# Patient Record
Sex: Female | Born: 1980 | Race: Black or African American | Hispanic: No | Marital: Married | Smoking: Never smoker
Health system: Southern US, Community
[De-identification: ages and names within clinical notes are randomized; demographics above are authoritative.]

## PROBLEM LIST (undated history)

## (undated) DIAGNOSIS — G43909 Migraine, unspecified, not intractable, without status migrainosus: Secondary | ICD-10-CM

## (undated) HISTORY — PX: TONSILLECTOMY: SUR1361

---

## 2012-05-03 ENCOUNTER — Emergency Department (HOSPITAL_BASED_OUTPATIENT_CLINIC_OR_DEPARTMENT_OTHER): Payer: BC Managed Care – PPO

## 2012-05-03 ENCOUNTER — Encounter (HOSPITAL_BASED_OUTPATIENT_CLINIC_OR_DEPARTMENT_OTHER): Payer: Self-pay | Admitting: *Deleted

## 2012-05-03 ENCOUNTER — Emergency Department (HOSPITAL_BASED_OUTPATIENT_CLINIC_OR_DEPARTMENT_OTHER)
Admission: EM | Admit: 2012-05-03 | Discharge: 2012-05-03 | Disposition: A | Discharge status: Home / Self Care | Payer: BC Managed Care – PPO | Attending: Emergency Medicine | Admitting: Emergency Medicine

## 2012-05-03 DIAGNOSIS — Z3490 Encounter for supervision of normal pregnancy, unspecified, unspecified trimester: Secondary | ICD-10-CM

## 2012-05-03 DIAGNOSIS — O9989 Other specified diseases and conditions complicating pregnancy, childbirth and the puerperium: Secondary | ICD-10-CM | POA: Insufficient documentation

## 2012-05-03 DIAGNOSIS — R079 Chest pain, unspecified: Secondary | ICD-10-CM

## 2012-05-03 DIAGNOSIS — R109 Unspecified abdominal pain: Secondary | ICD-10-CM | POA: Insufficient documentation

## 2012-05-03 DIAGNOSIS — R0602 Shortness of breath: Secondary | ICD-10-CM | POA: Insufficient documentation

## 2012-05-03 LAB — URINALYSIS, ROUTINE W REFLEX MICROSCOPIC
Bilirubin Urine: NEGATIVE
Nitrite: NEGATIVE
Specific Gravity, Urine: 1.03 (ref 1.005–1.030)
pH: 6 (ref 5.0–8.0)

## 2012-05-03 LAB — URINE MICROSCOPIC-ADD ON

## 2012-05-03 LAB — PREGNANCY, URINE: Preg Test, Ur: POSITIVE — AB

## 2012-05-03 MED ORDER — ONDANSETRON 8 MG PO TBDP: 8.0000 mg | ORAL_TABLET | Freq: Three times a day (TID) | ORAL | Status: DC | PRN

## 2012-05-03 NOTE — ED Notes (Signed)
Patient transported to X-ray 

## 2012-05-03 NOTE — ED Notes (Signed)
Patient states she has had intermittent chest pain causing sob for the last 2 weeks.  States she does not fell like she is sick.  Became very tearful and stated she thinks she may be pregnant because she is having vomiting in the evening and has not had her menstrual since Mar 09, 2012.

## 2012-05-03 NOTE — ED Provider Notes (Signed)
History     CSN: [id number]  Arrival date & time 05/03/12  1220   First MD Initiated Contact with Patient 05/03/12 1252      Chief Complaint  Patient presents with  . Shortness of Breath    (Consider location/radiation/quality/duration/timing/severity/associated sxs/prior treatment) Patient is a 32 y.o. female presenting with shortness of breath. The history is provided by the patient.  Shortness of Breath  Associated symptoms include chest pain and shortness of breath.   patient has had chest pain in her upper chest with some shortness of breath the last 2 weeks. It comes and goes. She also has some dull pain sometimes in between episodes. She cannot make it come on. No cough. No fevers. No hemoptysis. No swelling or legs. She's not on birth control pills. She states that she's where she could be pregnant since her last period was in November. No rest pain. No vaginal bleeding or discharge.  History reviewed. No pertinent past medical history.  Past Surgical History  Procedure Date  . Cesarean section     No family history on file.  History  Substance Use Topics  . Smoking status: Never Smoker   . Smokeless tobacco: Not on file  . Alcohol Use: No    OB History    Grav Para Term Preterm Abortions TAB SAB Ect Mult Living                  Review of Systems  Constitutional: Negative for activity change and appetite change.  HENT: Negative for neck stiffness.   Eyes: Negative for pain.  Respiratory: Positive for shortness of breath. Negative for chest tightness.   Cardiovascular: Positive for chest pain. Negative for leg swelling.  Gastrointestinal: Negative for nausea, vomiting, abdominal pain and diarrhea.  Genitourinary: Negative for flank pain and vaginal bleeding.  Musculoskeletal: Negative for back pain.  Skin: Negative for rash.  Neurological: Negative for weakness, numbness and headaches.  Psychiatric/Behavioral: Negative for behavioral problems.     Allergies  Review of patient's allergies indicates no known allergies.  Home Medications  No current outpatient prescriptions on file.  BP 102/77  Pulse 76  Temp 98.9 F (37.2 C) (Oral)  Resp 20  Ht 5\' 11"  (1.803 m)  Wt 147 lb (66.679 kg)  BMI 20.50 kg/m2  SpO2 100%  LMP 03/09/2012  Physical Exam  Nursing note and vitals reviewed. Constitutional: She is oriented to person, place, and time. She appears well-developed and well-nourished.  HENT:  Head: Normocephalic and atraumatic.  Eyes: EOM are normal. Pupils are equal, round, and reactive to light.  Neck: Normal range of motion. Neck supple.  Cardiovascular: Normal rate, regular rhythm and normal heart sounds.   No murmur heard. Pulmonary/Chest: Effort normal and breath sounds normal. No respiratory distress. She has no wheezes. She has no rales.       Mild tenderness to upper chest without crepitance or deformity. No rash.  Abdominal: Soft. Bowel sounds are normal. She exhibits no distension. There is no tenderness. There is no rebound and no guarding.  Musculoskeletal: Normal range of motion.  Neurological: She is alert and oriented to person, place, and time. No cranial nerve deficit.  Skin: Skin is warm and dry.  Psychiatric: She has a normal mood and affect. Her speech is normal.    ED Course  Procedures (including critical care time)  Labs Reviewed  URINALYSIS, ROUTINE W REFLEX MICROSCOPIC - Abnormal; Notable for the following:    Ketones, ur >80 (*)  Leukocytes, UA TRACE (*)     All other components within normal limits  PREGNANCY, URINE - Abnormal; Notable for the following:    Preg Test, Ur POSITIVE (*)     All other components within normal limits  URINE MICROSCOPIC-ADD ON - Abnormal; Notable for the following:    Squamous Epithelial / LPF FEW (*)     Bacteria, UA FEW (*)     All other components within normal limits  URINE CULTURE   No results found.   1. Chest pain   2. Pregnant       Date: 05/03/2012  Rate: 70  Rhythm: normal sinus rhythm  QRS Axis: normal  Intervals: normal  ST/T Wave abnormalities: normal  Conduction Disutrbances: none  Narrative Interpretation: unremarkable     MDM  Patient presents with chest pain. As dull in the upper chest. She's had some vomiting in the evening. She is pregnant. She is well-appearing and tolerated orals. She has a few times but increase her oral intake at home. She has an obstetrician to followup with. She elected not get chest x-ray. Lungs are clear. EKG is reassuring patient was informed that we are not ruled out ectopic pregnancy, however her story is not worrisome for it now.        Juliet Rude. Rubin Payor, MD 05/03/12 1354

## 2012-05-04 LAB — URINE CULTURE
Colony Count: NO GROWTH
Culture: NO GROWTH

## 2017-09-13 ENCOUNTER — Other Ambulatory Visit: Payer: Self-pay

## 2017-09-13 ENCOUNTER — Encounter (HOSPITAL_BASED_OUTPATIENT_CLINIC_OR_DEPARTMENT_OTHER): Payer: Self-pay

## 2017-09-13 ENCOUNTER — Emergency Department (HOSPITAL_BASED_OUTPATIENT_CLINIC_OR_DEPARTMENT_OTHER)
Admission: EM | Admit: 2017-09-13 | Discharge: 2017-09-14 | Disposition: A | Discharge status: Home / Self Care | Payer: No Typology Code available for payment source | Attending: Emergency Medicine | Admitting: Emergency Medicine

## 2017-09-13 ENCOUNTER — Emergency Department (HOSPITAL_BASED_OUTPATIENT_CLINIC_OR_DEPARTMENT_OTHER): Payer: No Typology Code available for payment source

## 2017-09-13 DIAGNOSIS — Y939 Activity, unspecified: Secondary | ICD-10-CM | POA: Insufficient documentation

## 2017-09-13 DIAGNOSIS — Y92410 Unspecified street and highway as the place of occurrence of the external cause: Secondary | ICD-10-CM | POA: Diagnosis not present

## 2017-09-13 DIAGNOSIS — Y999 Unspecified external cause status: Secondary | ICD-10-CM | POA: Diagnosis not present

## 2017-09-13 DIAGNOSIS — S40012A Contusion of left shoulder, initial encounter: Secondary | ICD-10-CM | POA: Diagnosis not present

## 2017-09-13 DIAGNOSIS — S4992XA Unspecified injury of left shoulder and upper arm, initial encounter: Secondary | ICD-10-CM | POA: Diagnosis present

## 2017-09-13 HISTORY — DX: Migraine, unspecified, not intractable, without status migrainosus: G43.909
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
## 2017-09-14 NOTE — Discharge Instructions (Addendum)
Ibuprofen 600 mg every six hours as needed for pain.  Follow up with primary doctor if not improving in the next week.

## 2017-09-14 NOTE — ED Notes (Signed)
Pt verbalizes understanding of d/c instructions and denies any further need at this time. 
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
## 2020-08-30 ENCOUNTER — Emergency Department (HOSPITAL_BASED_OUTPATIENT_CLINIC_OR_DEPARTMENT_OTHER)
Admission: EM | Admit: 2020-08-30 | Discharge: 2020-08-30 | Disposition: A | Discharge status: Home / Self Care | Payer: BC Managed Care – PPO | Attending: Emergency Medicine | Admitting: Emergency Medicine

## 2020-08-30 ENCOUNTER — Encounter (HOSPITAL_BASED_OUTPATIENT_CLINIC_OR_DEPARTMENT_OTHER): Payer: Self-pay

## 2020-08-30 ENCOUNTER — Other Ambulatory Visit: Payer: Self-pay

## 2020-08-30 DIAGNOSIS — M545 Low back pain, unspecified: Secondary | ICD-10-CM | POA: Insufficient documentation

## 2020-08-30 DIAGNOSIS — M940 Chondrocostal junction syndrome [Tietze]: Secondary | ICD-10-CM | POA: Insufficient documentation

## 2020-08-30 DIAGNOSIS — R072 Precordial pain: Secondary | ICD-10-CM | POA: Diagnosis present

## 2020-08-30 MED ORDER — CYCLOBENZAPRINE HCL 10 MG PO TABS
10.0000 mg | ORAL_TABLET | Freq: Once | ORAL | Status: AC
  Administered 2020-08-30: 10 mg via ORAL
  Filled 2020-08-30: qty 1

## 2020-08-30 MED ORDER — CYCLOBENZAPRINE HCL 10 MG PO TABS: 10.0000 mg | ORAL_TABLET | Freq: Three times a day (TID) | ORAL | 0 refills | Status: AC | PRN

## 2020-08-30 MED ORDER — NAPROXEN 250 MG PO TABS
500.0000 mg | ORAL_TABLET | Freq: Once | ORAL | Status: AC
  Administered 2020-08-30: 500 mg via ORAL
  Filled 2020-08-30: qty 2

## 2020-08-30 MED ORDER — NAPROXEN 375 MG PO TABS: ORAL_TABLET | ORAL | 0 refills | Status: DC

## 2020-08-30 NOTE — ED Provider Notes (Signed)
[facility] Provider Note: [name], MD, FACEP  CSN: [id number] MRN: [id number] ARRIVAL: 08/30/20 at 0227 ROOM: [facility]07/[facility]   CHIEF COMPLAINT  Chest Pain   HISTORY OF PRESENT ILLNESS  08/30/20 3:23 AM Cristina Jones is a 40 y.o. female with about a 1 week history of pain in her chest.  The pain is located along the right edge of the sternum.  The pain has sharp and dull components and is well localized.  She has a subjective sensation of a knot at that point.  Pain is worse with movement or palpation.  She rates the pain is a 7 out of 10.  She is also had a 1 day history of pain in her mid lower back which she describes as feeling like a muscle spasm.  She rates this as a 9 out of 10.  She denies any injury to her back.  She has a sensation of swelling associated with this pain.  Past Medical History:  Diagnosis Date  . Migraine     Past Surgical History:  Procedure Laterality Date  . CESAREAN SECTION    . CESAREAN SECTION    . TONSILLECTOMY      History reviewed. No pertinent family history.  Social History   Tobacco Use  . Smoking status: Never Smoker  . Smokeless tobacco: Never Used  Substance Use Topics  . Alcohol use: No  . Drug use: No    Prior to Admission medications   Medication Sig Start Date End Date Taking? Authorizing Provider  cyclobenzaprine (FLEXERIL) 10 MG tablet Take 1 tablet (10 mg total) by mouth 3 (three) times daily as needed for muscle spasms. 08/30/20  Yes Caedin Mogan, MD  naproxen (NAPROSYN) 375 MG tablet Take 1 tablet twice daily as needed for pain. 08/30/20  Yes Aishani Kalis, MD    Allergies Patient has no known allergies.   REVIEW OF SYSTEMS  Negative except as noted here or in the History of Present Illness.   PHYSICAL EXAMINATION  Initial Vital Signs Blood pressure 119/83, pulse 61, temperature 97.9 F (36.6 C), temperature source Oral, resp. rate 14, height 5\' 8"  (1.727 m), weight 77.1 kg, SpO2 100  %.  Examination General: Well-developed, well-nourished female in no acute distress; appearance consistent with age of record HENT: normocephalic; atraumatic Eyes: Normal appearance Neck: supple Heart: regular rate and rhythm Lungs: clear to auscultation bilaterally Chest: Right upper costosternal tenderness Abdomen: soft; nondistended; nontender; bowel sounds present Back: Mid lumbar tenderness Extremities: No deformity; full range of motion Neurologic: Awake, alert and oriented; motor function intact in all extremities and symmetric; no facial droop Skin: Warm and dry Psychiatric: Normal mood and affect   RESULTS  Summary of this visit's results, reviewed and interpreted by myself:   EKG Interpretation  Date/Time:    Ventricular Rate:    PR Interval:    QRS Duration:   QT Interval:    QTC Calculation:   R Axis:     Text Interpretation:        Laboratory Studies: No results found for this or any previous visit (from the past 24 hour(s)). Imaging Studies: No results found.  ED COURSE and MDM  Nursing notes, initial and subsequent vitals signs, including pulse oximetry, reviewed and interpreted by myself.  Vitals:   08/30/20 0234 08/30/20 0238  BP: 119/83   Pulse: 61   Resp: 14   Temp: 97.9 F (36.6 C)   TempSrc: Oral   SpO2: 100%   Weight:  77.1 kg  Height:  5\' 8"  (1.727 m)   Medications  naproxen (NAPROSYN) tablet 500 mg (has no administration in time range)  cyclobenzaprine (FLEXERIL) tablet 10 mg (has no administration in time range)    Patient's chest pain is consistent with costochondritis.  The cause of her back pain is unclear but does not appear to be due to an injury.  We will treat with an NSAID and Flexeril.  PROCEDURES  Procedures   ED DIAGNOSES     ICD-10-CM   1. Costochondritis  M94.0   2. Lumbar pain  M54.50        Lavera Vandermeer, , MD 08/30/20 [phone]

## 2020-08-30 NOTE — ED Triage Notes (Signed)
States she thinks she pulled a muscle in her lower back, notices swelling also. Was at work tonight and noticed a nodule on her right upper chest associated with pain upon palpation and movement.

## 2023-01-11 ENCOUNTER — Emergency Department (HOSPITAL_BASED_OUTPATIENT_CLINIC_OR_DEPARTMENT_OTHER)
Admission: EM | Admit: 2023-01-11 | Discharge: 2023-01-12 | Disposition: A | Discharge status: Home / Self Care | Payer: No Typology Code available for payment source | Attending: Emergency Medicine | Admitting: Emergency Medicine

## 2023-01-11 ENCOUNTER — Other Ambulatory Visit: Payer: Self-pay

## 2023-01-11 ENCOUNTER — Encounter (HOSPITAL_BASED_OUTPATIENT_CLINIC_OR_DEPARTMENT_OTHER): Payer: Self-pay

## 2023-01-11 DIAGNOSIS — L02511 Cutaneous abscess of right hand: Secondary | ICD-10-CM | POA: Diagnosis present

## 2023-01-11 NOTE — ED Triage Notes (Signed)
Pt states she had a laceration on her thumb on Thursday and now has pain and numbness to that area.  +swollen area noted on knuckle

## 2023-01-12 MED ORDER — LIDOCAINE HCL 2 % IJ SOLN
10.0000 mL | Freq: Once | INTRAMUSCULAR | Status: AC
  Administered 2023-01-12: 200 mg
  Filled 2023-01-12: qty 20

## 2023-01-12 MED ORDER — DOXYCYCLINE HYCLATE 100 MG PO TABS
100.0000 mg | ORAL_TABLET | Freq: Once | ORAL | Status: AC
  Administered 2023-01-12: 100 mg via ORAL
  Filled 2023-01-12: qty 1

## 2023-01-12 MED ORDER — DOXYCYCLINE HYCLATE 100 MG PO CAPS: 100.0000 mg | ORAL_CAPSULE | Freq: Two times a day (BID) | ORAL | 0 refills | Status: AC

## 2023-01-12 NOTE — ED Provider Notes (Signed)
[place] [facility]Y DEPARTMENT AT [facility]  Provider Note  CSN: [id number] Arrival date & time: 01/11/23 2322  History Chief Complaint  Patient presents with   thumb pain    Cristina Jones is a 42 y.o. female reports she cut her R thumb at work about a week ago, did not require repair but has had increased pain, swelling since then. No drainage. Some numbness distally.    Home Medications Prior to Admission medications   Medication Sig Start Date End Date Taking? Authorizing Provider  doxycycline (VIBRAMYCIN) 100 MG capsule Take 1 capsule (100 mg total) by mouth 2 (two) times daily. 01/12/23  Yes Pollyann Savoy, MD  cyclobenzaprine (FLEXERIL) 10 MG tablet Take 1 tablet (10 mg total) by mouth 3 (three) times daily as needed for muscle spasms. 08/30/20   Molpus, John, MD  naproxen (NAPROSYN) 375 MG tablet Take 1 tablet twice daily as needed for pain. 08/30/20   Molpus, Jonny Ruiz, MD     Allergies    Patient has no known allergies.   Review of Systems   Review of Systems Please see HPI for pertinent positives and negatives  Physical Exam BP 92/68 (BP Location: Left Arm)   Pulse 79   Temp 97.8 F (36.6 C)   Resp 18   Ht 5\' 11"  (1.803 m)   Wt 80.3 kg   SpO2 100%   BMI 24.69 kg/m   Physical Exam Vitals and nursing note reviewed.  HENT:     Head: Normocephalic.     Nose: Nose normal.  Eyes:     Extraocular Movements: Extraocular movements intact.  Pulmonary:     Effort: Pulmonary effort is normal.  Musculoskeletal:        General: Normal range of motion.     Cervical back: Neck supple.  Skin:    Findings: No rash (on exposed skin).     Comments: Healing wound over the R 1st IP joint with some central fluctuance, diffuse tenderness, mild erythema.   Neurological:     Mental Status: She is alert and oriented to person, place, and time.  Psychiatric:        Mood and Affect: Mood normal.     ED Results / Procedures / Treatments    EKG None  Procedures .Marland KitchenIncision and Drainage  Date/Time: 01/12/2023 1:21 AM  Performed by: Pollyann Savoy, MD Authorized by: Pollyann Savoy, MD   Consent:    Consent obtained:  Verbal   Consent given by:  Patient Location:    Type:  Abscess   Location:  Upper extremity   Upper extremity location:  Finger   Finger location:  R thumb Pre-procedure details:    Skin preparation:  Chlorhexidine with alcohol Anesthesia:    Anesthesia method:  Nerve block   Block anesthetic:  Lidocaine 2% w/o epi   Block technique:  Digital   Block outcome:  Anesthesia achieved Procedure type:    Complexity:  Simple Procedure details:    Incision types:  Single straight   Drainage:  Purulent   Drainage amount:  Scant   Wound treatment:  Wound left open Post-procedure details:    Procedure completion:  Tolerated well, no immediate complications   Medications Ordered in the ED Medications  lidocaine (XYLOCAINE) 2 % (with pres) injection 200 mg (has no administration in time range)  doxycycline (VIBRA-TABS) tablet 100 mg (has no administration in time range)    Initial Impression and Plan  Patient here with small abscess on  dorsal R thumb after recent laceration. Drained as above. Doxycycline for surrounding cellulitis. PCP follow up, RTED for any other concerns.    ED Course       MDM Rules/Calculators/A&P Medical Decision Making Problems Addressed: Abscess of right thumb: acute illness or injury  Risk Prescription drug management.     Final Clinical Impression(s) / ED Diagnoses Final diagnoses:  Abscess of right thumb    Rx / DC Orders ED Discharge Orders          Ordered    doxycycline (VIBRAMYCIN) 100 MG capsule  2 times daily        01/12/23 0123             Pollyann Savoy, MD 01/12/23 [phone]

## 2023-03-03 ENCOUNTER — Emergency Department (HOSPITAL_BASED_OUTPATIENT_CLINIC_OR_DEPARTMENT_OTHER): Payer: No Typology Code available for payment source

## 2023-03-03 ENCOUNTER — Encounter (HOSPITAL_BASED_OUTPATIENT_CLINIC_OR_DEPARTMENT_OTHER): Payer: Self-pay | Admitting: Urology

## 2023-03-03 ENCOUNTER — Emergency Department (HOSPITAL_BASED_OUTPATIENT_CLINIC_OR_DEPARTMENT_OTHER)
Admission: EM | Admit: 2023-03-03 | Discharge: 2023-03-03 | Disposition: A | Discharge status: Home / Self Care | Payer: No Typology Code available for payment source | Attending: Emergency Medicine | Admitting: Emergency Medicine

## 2023-03-03 ENCOUNTER — Other Ambulatory Visit: Payer: Self-pay

## 2023-03-03 DIAGNOSIS — M79671 Pain in right foot: Secondary | ICD-10-CM | POA: Insufficient documentation

## 2023-03-03 DIAGNOSIS — W208XXA Other cause of strike by thrown, projected or falling object, initial encounter: Secondary | ICD-10-CM | POA: Insufficient documentation

## 2023-03-03 MED ORDER — ACETAMINOPHEN 500 MG PO TABS
1000.0000 mg | ORAL_TABLET | Freq: Once | ORAL | Status: AC
  Administered 2023-03-03: 1000 mg via ORAL
  Filled 2023-03-03: qty 2

## 2023-03-03 MED ORDER — KETOROLAC TROMETHAMINE 15 MG/ML IJ SOLN
15.0000 mg | Freq: Once | INTRAMUSCULAR | Status: AC
  Administered 2023-03-03: 15 mg via INTRAMUSCULAR
  Filled 2023-03-03: qty 1

## 2023-03-03 NOTE — Discharge Instructions (Signed)
No fracture or other concerns on the x-ray.  You likely have a contusion/bruise to the bone causing the pain.  Take Tylenol, ibuprofen as discussed.  You can take up to 1000 mg of Tylenol every 8 hours, 600 mg of ibuprofen every 6-8 hours, and apply ice every 4-5 hours for 10 to 15 minutes.  For any concerning symptoms return to the emergency room.

## 2023-03-03 NOTE — ED Triage Notes (Signed)
Pt states heavy box fell on foot this am  States pain to right great toe and 2nd toe  States can't feel much in those toes  Swelling noted

## 2023-03-03 NOTE — ED Provider Notes (Signed)
EMERGENCY DEPARTMENT AT [facility] Provider Note   CSN: [id number] Arrival date & time: 03/03/23  1112     History  Chief Complaint  Patient presents with   Toe Injury    Cristina Jones is a 42 y.o. female.  42 year old female presents today for concern of pain to the right great toe.  This started after a box of motor oil fell on her foot.  Fell from a shelf that was just slightly tolerated at her.  Denies other complaints.  The history is provided by the patient. No language interpreter was used.       Home Medications Prior to Admission medications   Medication Sig Start Date End Date Taking? Authorizing Provider  cyclobenzaprine (FLEXERIL) 10 MG tablet Take 1 tablet (10 mg total) by mouth 3 (three) times daily as needed for muscle spasms. 08/30/20   Molpus, John, MD  doxycycline (VIBRAMYCIN) 100 MG capsule Take 1 capsule (100 mg total) by mouth 2 (two) times daily. 01/12/23   Pollyann Savoy, MD  naproxen (NAPROSYN) 375 MG tablet Take 1 tablet twice daily as needed for pain. 08/30/20   Molpus, Jonny Ruiz, MD      Allergies    Patient has no known allergies.    Review of Systems   Review of Systems  Musculoskeletal:  Positive for arthralgias.  All other systems reviewed and are negative.   Physical Exam Updated Vital Signs BP 121/68 (BP Location: Right Arm)   Pulse 77   Temp 98.5 F (36.9 C) (Oral)   Resp 16   Ht 5\' 11"  (1.803 m)   Wt 78 kg   SpO2 99%   BMI 23.99 kg/m  Physical Exam Vitals and nursing note reviewed.  Constitutional:      General: She is not in acute distress.    Appearance: Normal appearance. She is not ill-appearing.  HENT:     Head: Normocephalic and atraumatic.     Nose: Nose normal.  Eyes:     Conjunctiva/sclera: Conjunctivae normal.  Pulmonary:     Effort: Pulmonary effort is normal. No respiratory distress.  Musculoskeletal:        General: No deformity.     Comments: Neurovascularly intact in the  right lower extremity.  There is tenderness palpation over the right great toe as well as just proximal to the right great toe.  Skin:    Findings: No rash.  Neurological:     Mental Status: She is alert.     ED Results / Procedures / Treatments   Labs (all labs ordered are listed, but only abnormal results are displayed) Labs Reviewed - No data to display  EKG None  Radiology No results found.  Procedures Procedures    Medications Ordered in ED Medications - No data to display  ED Course/ Medical Decision Making/ A&P                                 Medical Decision Making Amount and/or Complexity of Data Reviewed Radiology: ordered.  Risk OTC drugs. Prescription drug management.   42 year old female presents today for concern of pain to the right great toe and right midfoot.  A box of motor oil fell on her foot.  No other complaints.  X-ray obtained.  No bony abnormality.  Likely a contusion.  Symptomatic management discussed.  Discharged in stable condition.   Final Clinical Impression(s) / ED Diagnoses Final diagnoses:  Right foot pain    Rx / DC Orders ED Discharge Orders     None         Marita [place], PA-C 03/03/23 1556    Alvira Monday, MD 03/03/23 2225

## 2024-04-19 ENCOUNTER — Emergency Department (HOSPITAL_BASED_OUTPATIENT_CLINIC_OR_DEPARTMENT_OTHER)
Admission: EM | Admit: 2024-04-19 | Discharge: 2024-04-19 | Disposition: A | Discharge status: Home / Self Care | Attending: Emergency Medicine | Admitting: Emergency Medicine

## 2024-04-19 ENCOUNTER — Other Ambulatory Visit: Payer: Self-pay

## 2024-04-19 ENCOUNTER — Encounter (HOSPITAL_BASED_OUTPATIENT_CLINIC_OR_DEPARTMENT_OTHER): Payer: Self-pay | Admitting: Emergency Medicine

## 2024-04-19 DIAGNOSIS — G43909 Migraine, unspecified, not intractable, without status migrainosus: Secondary | ICD-10-CM | POA: Diagnosis present

## 2024-04-19 LAB — URINALYSIS, ROUTINE W REFLEX MICROSCOPIC
Bilirubin Urine: NEGATIVE
Glucose, UA: NEGATIVE mg/dL
Hgb urine dipstick: NEGATIVE
Ketones, ur: NEGATIVE mg/dL
Leukocytes,Ua: NEGATIVE
Nitrite: NEGATIVE
Protein, ur: NEGATIVE mg/dL
Specific Gravity, Urine: 1.01 (ref 1.005–1.030)
pH: 6.5 (ref 5.0–8.0)

## 2024-04-19 LAB — COMPREHENSIVE METABOLIC PANEL WITH GFR
ALT: 18 U/L (ref 0–44)
AST: 30 U/L (ref 15–41)
Albumin: 4.8 g/dL (ref 3.5–5.0)
Alkaline Phosphatase: 64 U/L (ref 38–126)
Anion gap: 13 (ref 5–15)
BUN: 8 mg/dL (ref 6–20)
CO2: 24 mmol/L (ref 22–32)
Calcium: 10 mg/dL (ref 8.9–10.3)
Chloride: 104 mmol/L (ref 98–111)
Creatinine, Ser: 0.78 mg/dL (ref 0.44–1.00)
GFR, Estimated: >60 mL/min
Glucose, Bld: 100 mg/dL — ABNORMAL HIGH (ref 70–99)
Potassium: 4.1 mmol/L (ref 3.5–5.1)
Sodium: 141 mmol/L (ref 135–145)
Total Bilirubin: 0.3 mg/dL (ref 0.0–1.2)
Total Protein: 8.8 g/dL — ABNORMAL HIGH (ref 6.5–8.1)

## 2024-04-19 LAB — CBC WITH DIFFERENTIAL/PLATELET
Abs Immature Granulocytes: 0.01 K/uL (ref 0.00–0.07)
Basophils Absolute: 0 K/uL (ref 0.0–0.1)
Basophils Relative: 1 %
Eosinophils Absolute: 0.1 K/uL (ref 0.0–0.5)
Eosinophils Relative: 1 %
HCT: 35.7 % — ABNORMAL LOW (ref 36.0–46.0)
Hemoglobin: 11.6 g/dL — ABNORMAL LOW (ref 12.0–15.0)
Immature Granulocytes: 0 %
Lymphocytes Relative: 38 %
Lymphs Abs: 2.6 K/uL (ref 0.7–4.0)
MCH: 26.3 pg (ref 26.0–34.0)
MCHC: 32.5 g/dL (ref 30.0–36.0)
MCV: 81 fL (ref 80.0–100.0)
Monocytes Absolute: 0.4 K/uL (ref 0.1–1.0)
Monocytes Relative: 6 %
Neutro Abs: 3.7 K/uL (ref 1.7–7.7)
Neutrophils Relative %: 54 %
Platelets: 307 K/uL (ref 150–400)
RBC: 4.41 MIL/uL (ref 3.87–5.11)
RDW: 14.2 % (ref 11.5–15.5)
WBC: 6.7 K/uL (ref 4.0–10.5)
nRBC: 0 % (ref 0.0–0.2)

## 2024-04-19 LAB — SALICYLATE LEVEL: Salicylate Lvl: <7 mg/dL — ABNORMAL LOW (ref 7.0–30.0)

## 2024-04-19 LAB — LIPASE, BLOOD: Lipase: 32 U/L (ref 11–51)

## 2024-04-19 LAB — PREGNANCY, URINE: Preg Test, Ur: NEGATIVE

## 2024-04-19 LAB — ACETAMINOPHEN LEVEL: Acetaminophen (Tylenol), Serum: <10 ug/mL — ABNORMAL LOW (ref 10–30)

## 2024-04-19 MED ORDER — PENICILLIN V POTASSIUM 250 MG PO TABS
500.0000 mg | ORAL_TABLET | Freq: Once | ORAL | Status: AC
  Administered 2024-04-19: 500 mg via ORAL
  Filled 2024-04-19: qty 2

## 2024-04-19 MED ORDER — PENICILLIN V POTASSIUM 500 MG PO TABS: 500.0000 mg | ORAL_TABLET | Freq: Four times a day (QID) | ORAL | 0 refills | Status: AC

## 2024-04-19 MED ORDER — CELECOXIB 200 MG PO CAPS: 200.0000 mg | ORAL_CAPSULE | Freq: Two times a day (BID) | ORAL | 0 refills | Status: DC

## 2024-04-19 MED ORDER — DIPHENHYDRAMINE HCL 50 MG/ML IJ SOLN
50.0000 mg | Freq: Once | INTRAMUSCULAR | Status: AC
  Administered 2024-04-19: 50 mg via INTRAVENOUS
  Filled 2024-04-19: qty 1

## 2024-04-19 MED ORDER — LACTATED RINGERS IV BOLUS
1000.0000 mL | Freq: Once | INTRAVENOUS | Status: AC
  Administered 2024-04-19: 1000 mL via INTRAVENOUS

## 2024-04-19 MED ORDER — ACETAMINOPHEN 500 MG PO TABS: 1000.0000 mg | ORAL_TABLET | Freq: Once | ORAL | Status: DC

## 2024-04-19 MED ORDER — CELECOXIB 200 MG PO CAPS: 200.0000 mg | ORAL_CAPSULE | Freq: Two times a day (BID) | ORAL | 0 refills | Status: AC

## 2024-04-19 MED ORDER — OXYCODONE HCL 5 MG PO TABS
5.0000 mg | ORAL_TABLET | Freq: Once | ORAL | Status: AC
  Administered 2024-04-19: 5 mg via ORAL
  Filled 2024-04-19: qty 1

## 2024-04-19 MED ORDER — PROCHLORPERAZINE EDISYLATE 10 MG/2ML IJ SOLN
10.0000 mg | Freq: Once | INTRAMUSCULAR | Status: AC
  Administered 2024-04-19: 10 mg via INTRAVENOUS
  Filled 2024-04-19: qty 2

## 2024-04-19 NOTE — ED Triage Notes (Addendum)
 Pt in POV with reported migraine x 3 days, endorses photophobia, blurred vision and dizziness. Pt states she has been medicating with Tylenol  and is afraid she's been taking more than she's supposed to - reports taking about 2 regular strength tabs every 2hrs and then a 3rd tab every 3rd hour while awake. Denies any n/v/d - reports R dental pain as well

## 2024-04-19 NOTE — ED Provider Notes (Signed)
 " [place] EMERGENCY DEPARTMENT AT [facility] Provider Note   CSN: [id number] Arrival date & time: 04/19/24  0310     History Chief Complaint  Patient presents with   Migraine    HPI Cristina Jones is a 44 y.o. female presenting for 2 separate chief complaints.  Primarily she states she is having a right sided migraine has been present for the last 3 days.  Has been taking Tylenol  at home without symptomatic control.  Comes in for breakthrough therapy. Endorses a longstanding history of migraines that she states have been well-controlled. She thinks is related to her second chief complaint which is an infected tooth.  States she has been told that she needs a root canal or extraction.  She is trying to save up the money for the root canal but states that the symptoms of gotten worse over the last 72 hours.  Versus substantial pain at that site.  Right upper tooth.   Patient's recorded medical, surgical, social, medication list and allergies were reviewed in the Snapshot window as part of the initial history.   Review of Systems   Review of Systems  Constitutional:  Negative for chills and fever.  HENT:  Positive for dental problem. Negative for ear pain and sore throat.   Eyes:  Negative for pain and visual disturbance.  Respiratory:  Negative for cough and shortness of breath.   Cardiovascular:  Negative for chest pain and palpitations.  Gastrointestinal:  Negative for abdominal pain and vomiting.  Genitourinary:  Negative for dysuria and hematuria.  Musculoskeletal:  Negative for arthralgias and back pain.  Skin:  Negative for color change and rash.  Neurological:  Positive for headaches. Negative for seizures and syncope.  All other systems reviewed and are negative.   Physical Exam Updated Vital Signs Temp 98.4 F (36.9 C) (Oral)   Resp 18   Wt 78 kg   BMI 23.98 kg/m  Physical Exam   ED Course/ Medical Decision Making/ A&P     Procedures Procedures   Medications Ordered in ED Medications  prochlorperazine (COMPAZINE) injection 10 mg (10 mg Intravenous Given 04/19/24 0355)  diphenhydrAMINE (BENADRYL) injection 50 mg (50 mg Intravenous Given 04/19/24 0355)  lactated ringers bolus 1,000 mL (0 mLs Intravenous Stopped 04/19/24 0600)  oxyCODONE (Oxy IR/ROXICODONE) immediate release tablet 5 mg (5 mg Oral Given 04/19/24 0506)  penicillin v potassium (VEETID) tablet 500 mg (500 mg Oral Given 04/19/24 0507)   Medical Decision Making:   Cristina Jones is a 44 y.o. female who presented to the ED today with headache detailed above.    Patient placed on continuous vitals and telemetry monitoring while in ED which was reviewed periodically.   Complete initial physical exam performed, notably the patient  was HDS in NAD.    Reviewed and confirmed nursing documentation for past medical history, family history, social history.    Initial Assessment:   With the patient's presentation of headache, most likely diagnosis is tension type headaches vs atypical migraines. Other diagnoses were considered including (but not limited to) intracranial mass, intracranial hemorrhage, intracranial infection including meningitis vs encephalitis, GCA, trigeminal neuralgia. These are considered less likely due to history of present illness and physical exam findings.    This is most consistent with an acute life/limb threatening illness complicated by underlying chronic conditions.   Timeline and slow onset is not consistent with SAH/ICH   Age and description of pain is not consistent with GCA   Lack of fever,meningismus is  not consistent with meningitis/encephalitis   Initial Plan:  Will initiate treatment with Compazine/Benardryll/NSAIDS/Tylenol  for treatment of nonspecific headache  Screening labs including CBC and Metabolic panel to evaluate for infectious or metabolic etiology of disease.  Urinalysis with reflex culture ordered to evaluate  for UTI or relevant urologic/nephrologic pathology.  Considered CTH to evaluate for structural IC etiology. However, timeline and presentation atypical like above. Will start on   Objective evaluation as below reviewed   Initial Study Results:   Laboratory  All laboratory results reviewed without evidence of clinically relevant pathology.    Final Assessment and Plan:   Headaches  resolved on current therapy. Most consistent with atypical/prolonged migraine due to resolution with neuroleptic. No acute distress. Tooth decay likely needs extraction. ABX for reversible pulpitis.  Supportive care reinforced and patient in agreement.   Clinical Impression:  1. Migraine without status migrainosus, not intractable, unspecified migraine type      Discharge   Final Clinical Impression(s) / ED Diagnoses Final diagnoses:  Migraine without status migrainosus, not intractable, unspecified migraine type    Rx / DC Orders ED Discharge Orders          Ordered    penicillin v potassium (VEETID) 500 MG tablet  4 times daily        04/19/24 0554    celecoxib (CELEBREX) 200 MG capsule  2 times daily,   Status:  Discontinued        04/19/24 0554    celecoxib (CELEBREX) 200 MG capsule  2 times daily        04/19/24 0555              Jerral Meth, MD 04/19/24 [phone]  "
